# Patient Record
Sex: Male | Born: 1970 | Race: White | Hispanic: No | Marital: Married | Smoking: Former smoker
Health system: Southern US, Community
[De-identification: ages and names within clinical notes are randomized; demographics above are authoritative.]

## PROBLEM LIST (undated history)

## (undated) DIAGNOSIS — K4090 Unilateral inguinal hernia, without obstruction or gangrene, not specified as recurrent: Secondary | ICD-10-CM

## (undated) DIAGNOSIS — D485 Neoplasm of uncertain behavior of skin: Secondary | ICD-10-CM

## (undated) DIAGNOSIS — B029 Zoster without complications: Secondary | ICD-10-CM

## (undated) DIAGNOSIS — R030 Elevated blood-pressure reading, without diagnosis of hypertension: Secondary | ICD-10-CM

## (undated) HISTORY — PX: HERNIA REPAIR: SHX51

---

## 2008-01-08 ENCOUNTER — Emergency Department: Payer: Self-pay | Admitting: Emergency Medicine

## 2014-07-29 ENCOUNTER — Inpatient Hospital Stay: Payer: Self-pay | Admitting: Internal Medicine

## 2014-07-29 LAB — CBC WITH DIFFERENTIAL/PLATELET
Basophil #: 0 10*3/uL (ref 0.0–0.1)
Basophil %: 0.4 %
Eosinophil #: 0 10*3/uL (ref 0.0–0.7)
Eosinophil %: 0 %
HCT: 47.2 % (ref 40.0–52.0)
HGB: 16.3 g/dL (ref 13.0–18.0)
Lymphocyte #: 1.1 10*3/uL (ref 1.0–3.6)
Lymphocyte %: 14.4 %
MCH: 31.2 pg (ref 26.0–34.0)
MCHC: 34.5 g/dL (ref 32.0–36.0)
MCV: 90 fL (ref 80–100)
MONOS PCT: 7.5 %
Monocyte #: 0.6 x10 3/mm (ref 0.2–1.0)
NEUTROS ABS: 6 10*3/uL (ref 1.4–6.5)
Neutrophil %: 77.7 %
Platelet: 191 10*3/uL (ref 150–440)
RBC: 5.22 10*6/uL (ref 4.40–5.90)
RDW: 12.4 % (ref 11.5–14.5)
WBC: 7.8 10*3/uL (ref 3.8–10.6)

## 2014-07-29 LAB — COMPREHENSIVE METABOLIC PANEL
ALBUMIN: 3.4 g/dL (ref 3.4–5.0)
Alkaline Phosphatase: 89 U/L (ref 46–116)
Anion Gap: 10 (ref 7–16)
BILIRUBIN TOTAL: 0.5 mg/dL (ref 0.2–1.0)
BUN: 12 mg/dL (ref 7–18)
Calcium, Total: 8.6 mg/dL (ref 8.5–10.1)
Chloride: 94 mmol/L — ABNORMAL LOW (ref 98–107)
Co2: 25 mmol/L (ref 21–32)
Creatinine: 1.23 mg/dL (ref 0.60–1.30)
EGFR (African American): >60
Glucose: 136 mg/dL — ABNORMAL HIGH (ref 65–99)
Osmolality: 261 (ref 275–301)
POTASSIUM: 3.3 mmol/L — AB (ref 3.5–5.1)
SGOT(AST): 39 U/L — ABNORMAL HIGH (ref 15–37)
SGPT (ALT): 35 U/L (ref 14–63)
SODIUM: 129 mmol/L — AB (ref 136–145)
Total Protein: 7.8 g/dL (ref 6.4–8.2)

## 2014-07-29 LAB — URINALYSIS, COMPLETE
Bacteria: NONE SEEN
Bilirubin,UR: NEGATIVE
Glucose,UR: NEGATIVE mg/dL (ref 0–75)
Ketone: NEGATIVE
Leukocyte Esterase: NEGATIVE
Nitrite: NEGATIVE
Ph: 6 (ref 4.5–8.0)
Protein: NEGATIVE
RBC,UR: <1 /HPF (ref 0–5)
Specific Gravity: 1.001 (ref 1.003–1.030)
Squamous Epithelial: NONE SEEN
WBC UR: NONE SEEN /HPF (ref 0–5)

## 2014-07-29 LAB — TROPONIN I

## 2014-07-29 LAB — CLOSTRIDIUM DIFFICILE(ARMC)

## 2014-07-29 LAB — LIPASE, BLOOD: Lipase: 72 U/L — ABNORMAL LOW (ref 73–393)

## 2014-07-30 LAB — BASIC METABOLIC PANEL
Anion Gap: 6 — ABNORMAL LOW (ref 7–16)
BUN: 4 mg/dL — ABNORMAL LOW (ref 7–18)
CALCIUM: 8 mg/dL — AB (ref 8.5–10.1)
CO2: 28 mmol/L (ref 21–32)
Chloride: 103 mmol/L (ref 98–107)
Creatinine: 0.92 mg/dL (ref 0.60–1.30)
EGFR (Non-African Amer.): >60
Glucose: 127 mg/dL — ABNORMAL HIGH (ref 65–99)
OSMOLALITY: 272 (ref 275–301)
POTASSIUM: 2.9 mmol/L — AB (ref 3.5–5.1)
Sodium: 137 mmol/L (ref 136–145)

## 2014-07-30 LAB — MAGNESIUM: MAGNESIUM: 2.1 mg/dL

## 2014-07-31 LAB — BASIC METABOLIC PANEL
Anion Gap: 7 (ref 7–16)
BUN: 3 mg/dL — AB (ref 7–18)
CO2: 29 mmol/L (ref 21–32)
CREATININE: 0.83 mg/dL (ref 0.60–1.30)
Calcium, Total: 8.1 mg/dL — ABNORMAL LOW (ref 8.5–10.1)
Chloride: 105 mmol/L (ref 98–107)
EGFR (African American): >60
GLUCOSE: 105 mg/dL — AB (ref 65–99)
Osmolality: 278 (ref 275–301)
Potassium: 3.4 mmol/L — ABNORMAL LOW (ref 3.5–5.1)
SODIUM: 141 mmol/L (ref 136–145)

## 2014-08-01 LAB — STOOL CULTURE

## 2014-10-27 NOTE — Discharge Summary (Signed)
PATIENT NAME:  Ian Young, Ian R MR#:  [id number] DATE OF BIRTH:  [birth year]  DATE OF ADMISSION:  07/29/2014 DATE OF DISCHARGE:  07/31/2014  DISCHARGE DIAGNOSES:  1.  Pancolitis.  2.  Hyperkalemia hyponatremia.   SECONDARY DIAGNOSIS: None.   CONSULTATIONS:  Gastroenterology, Dr. Dow AdolphMatthew Rein    PROCEDURES AND RADIOLOGY:  CT scan of the abdomen and pelvis with contrast on 07/29/2014 showed colitis of near entire colon most significantly involving sigmoid colon. No perforation or abscess.   MAJOR LABORATORY PANEL:  1.  Urinalysis on admission was negative. 2.  Stool for Clostridium difficile was negative.  3.  Stool for comprehensive culture was negative.   HISTORY AND SHORT HOSPITAL COURSE: The patient is a 44 year old male with no significant medical problems who was admitted for abdominal pain, nausea, vomiting, diarrhea. Underwent CT scan of the abdomen and pelvis in the Emergency Department, showing pan colitis. Please see Dr. Margaretmary EddyShah's dictated history and physical for further details. The patient was started on IV Cipro and Flagyl.  A GI consultation was also obtained with Dr. Dow AdolphMatthew Rein who recommended continuing IV Cipro and Flagyl and slowly advancing the diet, which was done during the inpatient stay.  The patient was feeling much better by 07/31/2014 and was back to his baseline, and he was discharged home in stable condition. He did tolerate the diet and was not requiring any pain medication.   VITAL SIGNS: On the date of discharge, his vital signs are as follows: Temperature 97.7, heart rate 80 per minute, respirations 18 per minute, blood pressure 122/74.  He was saturating 98% on room air.    PERTINENT PHYSICAL EXAMINATION ON THE DATE OF DISCHARGE:  CARDIOVASCULAR: S1, S2 normal. No murmurs or gallops.  LUNGS: Clear to auscultation bilaterally. No wheezing, rales, rhonchi, crepitation.  ABDOMEN: Soft, benign.  NEUROLOGIC: Nonfocal examination. All other physical examination  remained at baseline.   DISCHARGE MEDICATIONS:  1.  Flagyl 500 mg p.o. t.i.d. for 5 day. 2.  Ciprofloxacin 500 mg p.o. b.i.d. for 5 days. 3.  Ibuprofen 600 mg p.o. 4 times a day for 5 days as needed.   DISCHARGE DIET: Regular.  Eat light for the first meal.   DISCHARGE ACTIVITY: As tolerated.   DISCHARGE INSTRUCTIONS AND FOLLOW-UP:  The patient was instructed to [facility] up [facility] in 2 to 4 weeks.  [facility] Dr. Dow AdolphMatthew Rein in 1 to 2 weeks.   TOTAL TIME DISCHARGING THIS PATIENT: 45 minutes.  He was given a work excuse from 07/29/2014 until 08/02/2014.       ____________________________ Ellamae SiaVipul S. Sherryll BurgerShah, MD vss:DT D: 07/31/2014 15:22:17 ET T: 07/31/2014 15:57:10 ET JOB#: [id number]  cc: Jnai Snellgrove S. Sherryll BurgerShah, MD, <Dictator> Dow AdolphMatthew Rein, MD [facility]  Ellamae SiaVIPUL S [facility] MD ELECTRONICALLY SIGNED 07/31/2014 20:20

## 2014-10-27 NOTE — H&P (Signed)
PATIENT NAME:  Ian Young, Ian Young MR#:  [id number] DATE OF BIRTH:  Mar 27, 1971  DATE OF ADMISSION:  07/29/2014  PRIMARY CARE PHYSICIAN: None.   REQUESTING PHYSICIAN: Koochiching SinkJade J. Dolores FrameSung, MD   CHIEF COMPLAINT: Abdominal pain, nausea, vomiting, diarrhea.   HISTORY OF PRESENT ILLNESS: The patient is a 44 year old male with no known medical problems, who is being admitted for possible colitis. The patient started having abdominal pain and fever since Saturday morning. He had to strain for his bowel, but were coming out to be very lose; about 1 every hour frequency. He was not able to keep any oral intake down; as soon as he would eat, he would start having 10 out of 10 pain, and had to go to the bathroom. After the bathroom, his pain would get relieved, but would come right back. He has tried Tylenol and Motrin, alternating for fever along with pain. He also continued to have nausea and some gagging, as he was not able to eat much. Starting this morning, he started having blood in the stool, which concerned him, and he decided to come to the Emergency Department.   While in the ED, he was having 10 out of 10 pain; his heart rate was 122, and he was found to have colitis based on a CT scan, and he is being admitted for further evaluation and management.   PAST MEDICAL HISTORY: None.   PAST SURGICAL HISTORY: The patient had hernia surgery 12 years ago.   ALLERGIES: SHELLFISH. NO OTHER DRUG ALLERGIES.   HOME MEDICATIONS: Naprosyn as needed for recent knee injury, although he is not taking anything for about a month now.   FAMILY HISTORY: His father had diabetes and also had hypertension. Grandmother and uncle had lung cancer.   SOCIAL HISTORY: Tobacco: No smoking. He does use electronic cigarettes and vapors.  Alcohol: He drinks about 12 beers a day, about 4-5 days a week. 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 He denies any IV drugs of abuse.   REVIEW OF  SYSTEMS:  CONSTITUTIONAL: No fever, fatigue, weakness.  EYES: No blurred or double vision.  ENT: No tinnitus or ear pain.  RESPIRATORY: No cough, wheezing, hemoptysis.  CARDIOVASCULAR: No chest pain, orthopnea, edema.  GASTROINTESTINAL: Positive for nausea, vomiting, diarrhea, abdominal pain.  GENITOURINARY: No dysuria or hematuria.  ENDOCRINE: No polyuria or nocturia.  HEMATOLOGY: Denied anemia or easy bruising.  SKIN: No rash or lesion.  MUSCULOSKELETAL: No arthritis or muscle cramp. He did have some recent knee injury requiring some Naprosyn as needed; some muscle strain.  NEUROLOGIC: No tingling, numbness, weakness.  PSYCHIATRY: No history of anxiety or depression.   PHYSICAL EXAMINATION:  VITAL SIGNS: Temperature 98.5, heart rate 122 per minute, respirations 20 per minute, blood pressure 157/108. He is saturating 98% on room air.  GENERAL: The patient is a 44 year old male lying in the bed, comfortably, without any acute distress.  EYES: Pupils equal, round, react to light and accommodation. No scleral icterus. Extraocular muscles intact.  HEENT: Head atraumatic, normocephalic. Oropharynx and nasopharynx clear. NECK: Supple. No jugular venous distention.  No thyroid enlargement or tenderness.  LUNGS: Clear to auscultation bilaterally. No wheezing, rales, rhonchi, or crepitation.  CARDIOVASCULAR: S1, S2 normal. Tachycardic. No murmurs, rubs or gallops.  ABDOMEN: Soft. Minimal tenderness in the left lower quadrant. Also some in the right lower quadrant; no guarding or rigidity.  Bowel sounds present. No organomegaly appreciated.  NEUROLOGIC: Cranial nerves  II through XII intact. Muscle strength 5/5 in all extremities. Sensation intact.  PSYCHIATRIC: The patient is alert and oriented x 3.  SKIN: No obvious rash, lesion or ulcer.  MUSCULOSKELETAL: No joint effusion or tenderness.  EXTREMITIES: no pedal edema, cyanosis or clubbing  LABORATORY DATA: Normal liver function tests, except  AST of 39. Normal first set of troponins. Normal CBC. Urinalysis is negative. BMP within normal limits, except sodium 129, potassium 3.3, chloride 94, blood sugar 126.   CT scan of the abdomen and pelvis with contrast in the Emergency Department showed colitis of the near entire colon, most significantly involving the sigmoid colon. No perforation or abscess.   EKG showed sinus tachycardia, LVH, no major ST-T changes.   IMPRESSION AND PLAN:  1.  Colitis. We will start him on IV Cipro and Flagyl. Consult gastroenterology. We will start him on IV hydration and obtain stool studies.  2.  Hypokalemia. We will replete and recheck. Check magnesium.  3.  Hyponatremia, likely due to dehydration from ongoing nausea, vomiting, diarrhea. We will start him on aggressive IV hydration and monitor sodium levels.   CODE STATUS: Full Code.  TOTAL TIME TAKING CARE OF THIS PATIENT: 40 minutes.    ____________________________ Ellamae Sia. Sherryll Burger, MD vss:MT D: 07/29/2014 15:49:25 ET T: 07/29/2014 16:26:26 ET JOB#: [id number]  cc: Jaydalynn Olivero S. Sherryll Burger, MD, <Dictator> Dow Adolph, MD Ellamae Sia [facility] MD ELECTRONICALLY SIGNED 07/30/2014 10:09

## 2014-10-27 NOTE — Consult Note (Signed)
PATIENT NAME:  Ian Young, Ian Young MR#:  [id number] DATE OF BIRTH:  1971/02/04  DATE OF CONSULTATION:  07/29/2014  REFERRING PHYSICIAN:  Vipul S. Sherryll Burger, MD CONSULTING PHYSICIAN:  Ian Adolph, MD  REASON FOR CONSULTATION: Diarrhea, pancolitis.   HISTORY OF PRESENT ILLNESS: [name] is a 44 year old male with no significant past medical history who is presenting to the hospital for evaluation of diarrhea and fever. [name] reports he was in his usual state of health until 2 days prior when he developed multiple episodes of liquidy diarrhea and also low-grade fevers. This continued over the weekend prompting him to present to the Emergency Room.   Here in the hospital starting this morning, he also started to develop some blood in his stools. Over the weekend, he was not having any blood whatsoever.   Of note, he went for a CT scan in the Emergency Room. The CT scan was notable for a pancolitis, which was worse in the sigmoid colon. Prior to this, he has not had any issues with diarrheal episodes. He denies any recent travel out of the country, any sick contacts, or any unusual foods that he has eaten recently.   PAST MEDICAL HISTORY: None.   PAST SURGICAL HISTORY: A hernia repair.   ALLERGIES: SHELLFISH.   HOME MEDICATIONS: P.r.n. nonsteroidal anti-inflammatories.   FAMILY HISTORY: No family history of GI malignancy.   SOCIAL HISTORY: He is a fairly heavy alcohol drinker, about 12 beers a day. He uses electronic cigarettes.   REVIEW OF SYSTEMS: A 10 system review was conducted. It is negative except as stated in the HPI.   PHYSICAL EXAMINATION:  VITAL SIGNS: Temperature 98.7, pulse is 81, respirations are 18, blood pressure 120/72, pulse oximetry is 97% on room air.  GENERAL: Alert and oriented times 4.  No acute distress. Appears stated age. HEENT: Normocephalic/atraumatic. Extraocular movements are intact. Anicteric. NECK: Soft, supple. JVP appears normal. No adenopathy. CHEST: Clear  to auscultation. No wheeze or crackle. Respirations unlabored. HEART: Regular. No murmur, rub, or gallop.  Normal S1 and S2. ABDOMEN: Mild diffuse tenderness to palpation. Normoactive bowel sounds. Soft. No rebound or guarding. EXTREMITIES: No swelling, well perfused. SKIN: No rash or lesion. Skin color, texture, turgor normal. NEUROLOGICAL: Grossly intact. PSYCHIATRIC: Normal tone and affect. MUSCULOSKELETAL: No joint swelling or erythema.   LABORATORY DATA: Sodium is 129, potassium 3.3, BUN 12, creatinine 1.23. Liver enzymes are normal except AST mildly elevated at 39. White count is 7.8, hemoglobin 16, hematocrit is 147, the platelets are 191,000.   IMAGING: See HPI for CT scan findings.   ASSESSMENT AND PLAN: Diarrhea, fever: He also does have a pancolitis on CT scan. Given the rather sudden onset over the weekend along with low grade fevers along with pain colitis, I do suspect this is most likely an infectious etiology. Now that he has developed some bleeding along with the diarrhea would potentially suggest a bacteria such as invasive Escherichia coli, Shigella, Campylobacter, or Salmonella. Clostridium difficile also can cause some rectal bleeding in the setting of severe inflammation. The other main item on the differential for this presentation with the pancolitis on CT would be inflammatory bowel disease. However, given the acute onset along with fever, I would favor an infectious etiology.   RECOMMENDATIONS:  1.  Empiric antibiotics are reasonable as you are doing.  2.  Follow Clostridium difficile and stool studies.  3.  If no improvement on antibiotics and no etiology on stool studies, would consider a colonoscopy for biopsies  at that juncture.   Thank you for the consult, we will follow.    ____________________________ Ian AdolphMatthew Toan Mort, MD mr:TT D: 07/29/2014 19:54:55 ET T: 07/29/2014 20:16:05 ET JOB#: [id number]  cc: Ian AdolphMatthew Geneva Barrero, MD, <Dictator> Ian FramesMATTHEW G Deniya Craigo  MD ELECTRONICALLY SIGNED 08/12/2014 15:12

## 2016-04-16 IMAGING — CT CT ABD-PELV W/ CM
2 of 5 series · 16 of 46 positions shown, 18 images · IV contrast (omnipaque)
Comparison: 01/08/2008

CLINICAL DATA: Left lower quadrant abdominal pain and fever.
Symptoms for 2 days.

EXAM:
CT ABDOMEN AND PELVIS WITH CONTRAST
TECHNIQUE: Multidetector CT imaging of the abdomen and pelvis was performed
using the standard protocol following bolus administration of
intravenous contrast.
CONTRAST:  100 mL Omnipaque 300 IV.

[Series 2: routine abd pel with · axial · 0.72mm/px · z∈[-514,-84]mm · 13 of 98 slices shown, 15 images]
[im 6/98  soft-tissue]
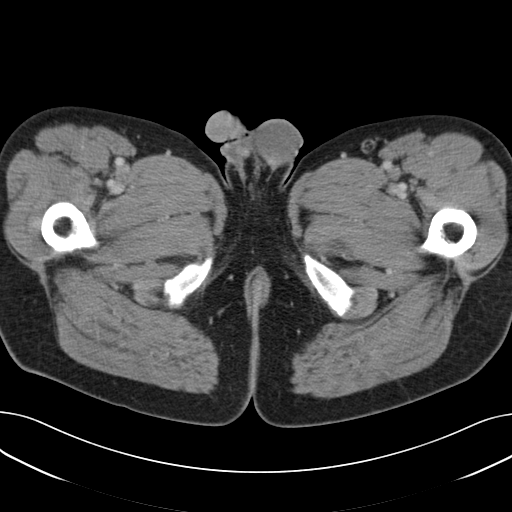
[im 6/98  bone]
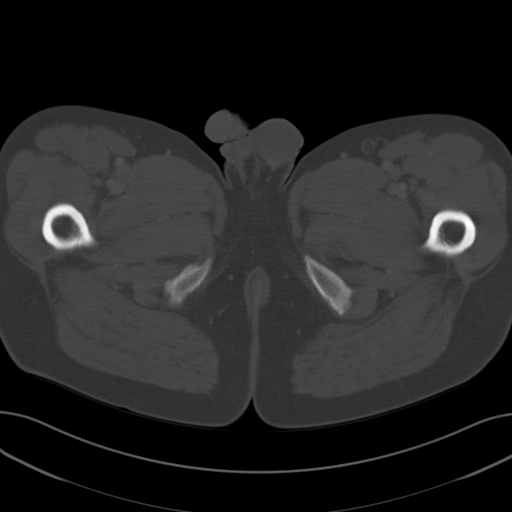
[im 11/98  soft-tissue]
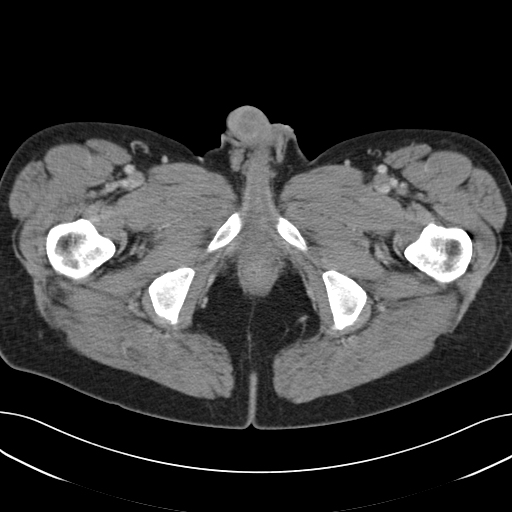
[im 22/98  soft-tissue]
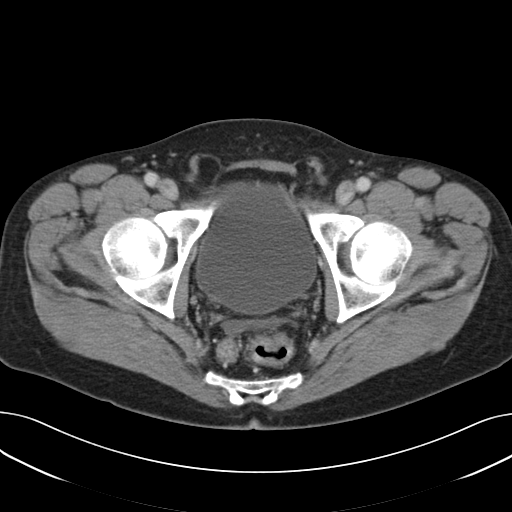
[im 27/98  soft-tissue]
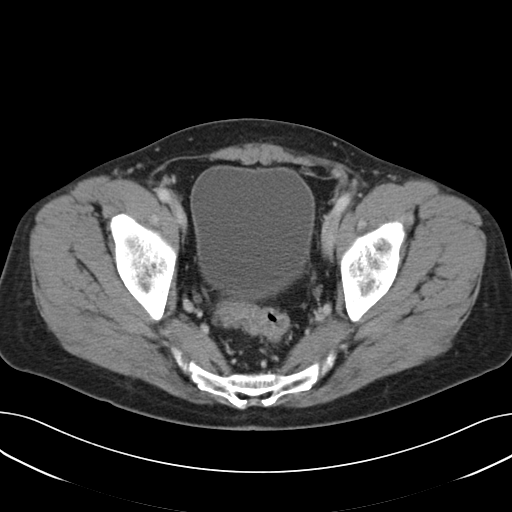
[im 33/98  soft-tissue]
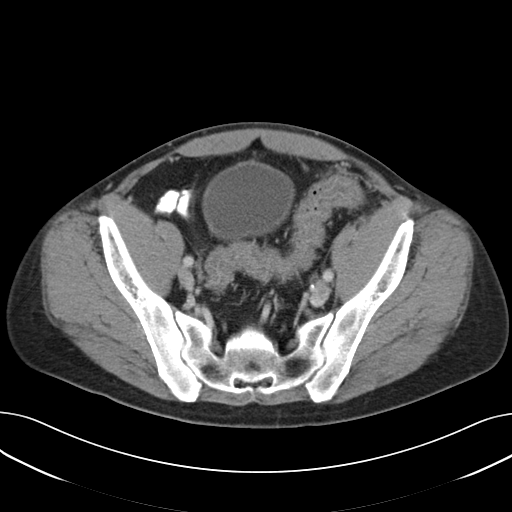
[im 44/98  soft-tissue]
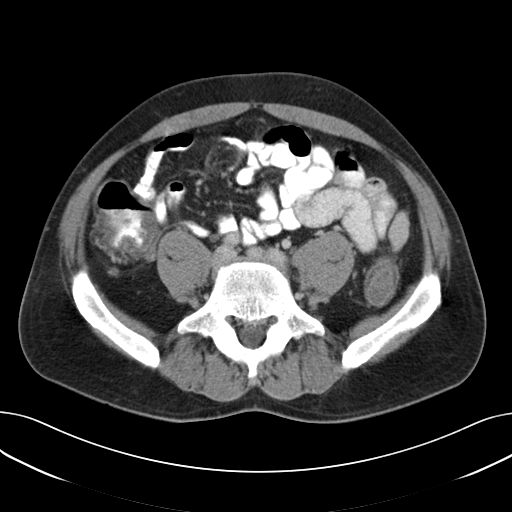
[im 49/98  soft-tissue]
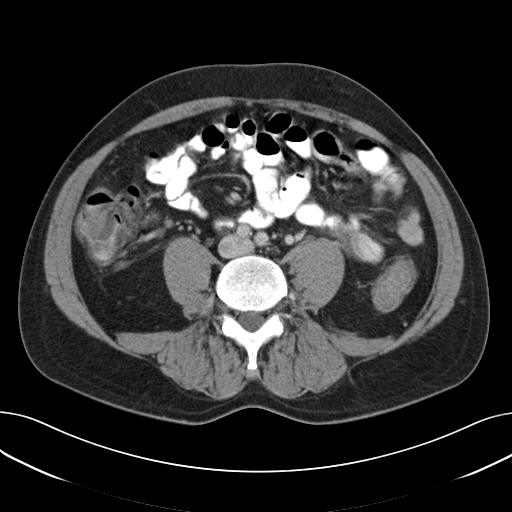
[im 54/98  soft-tissue]
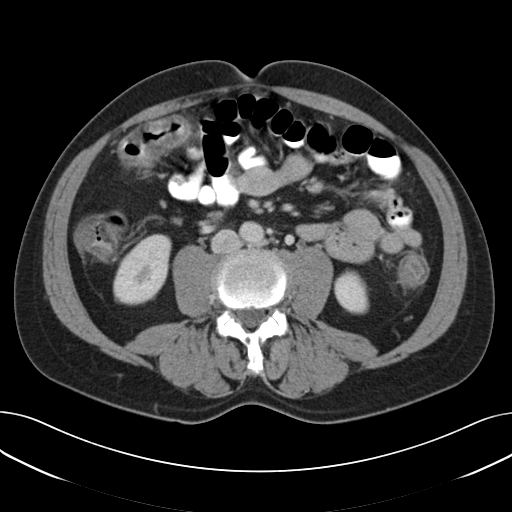
[im 65/98  soft-tissue]
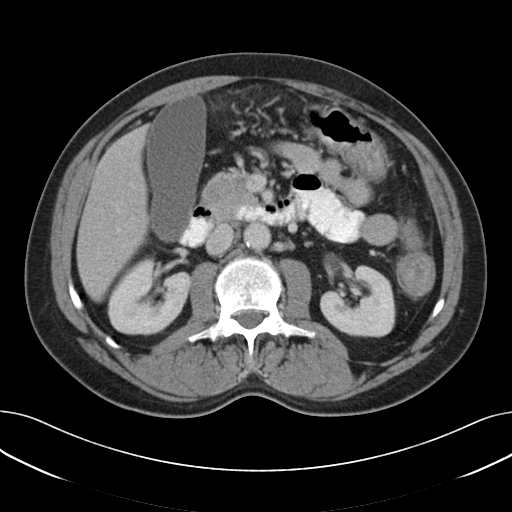
[im 65/98  bone]
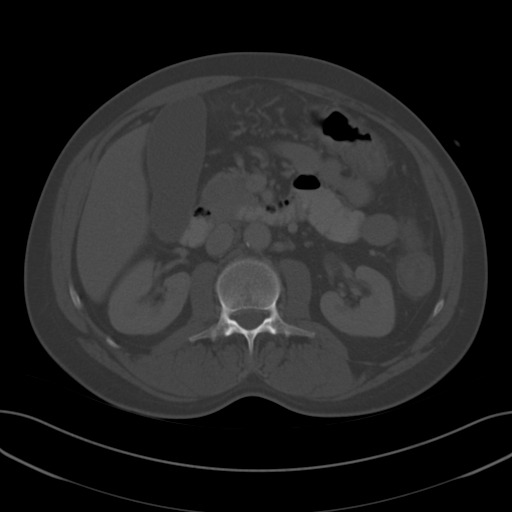
[im 71/98  soft-tissue]
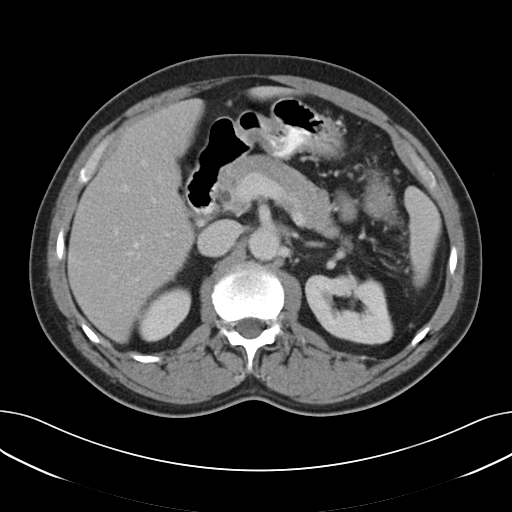
[im 76/98  soft-tissue]
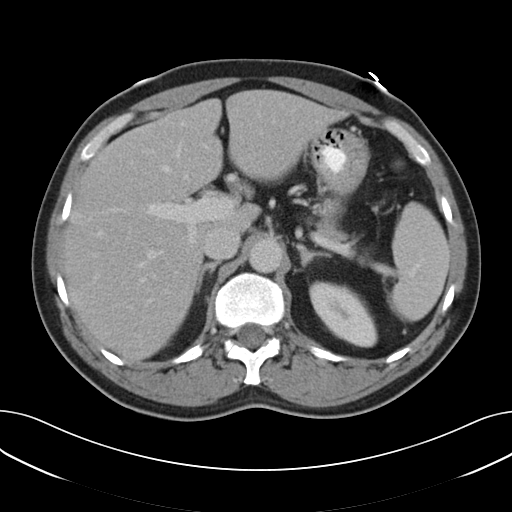
[im 87/98  soft-tissue]
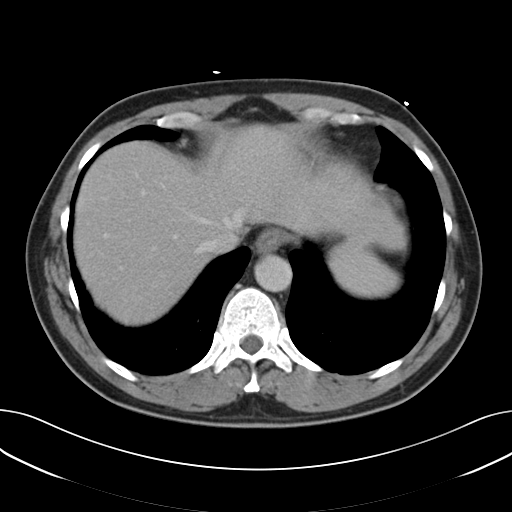
[im 92/98  soft-tissue]
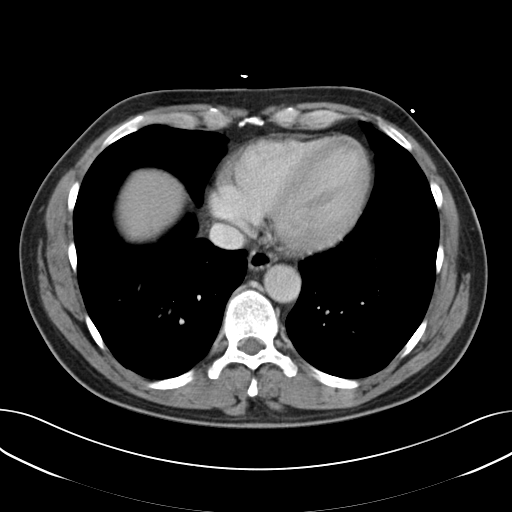

[Series 5: cor routine abd pel with · coronal · 0.75mm/px · 3 of 125 slices shown]
[im 42/125  soft-tissue]
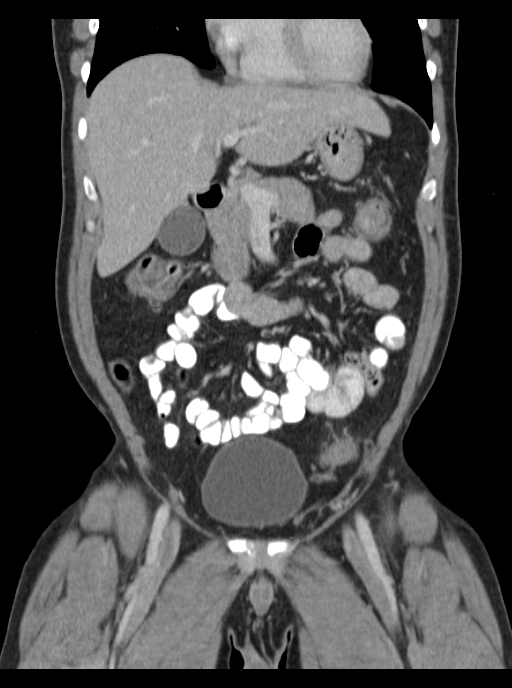
[im 56/125  soft-tissue]
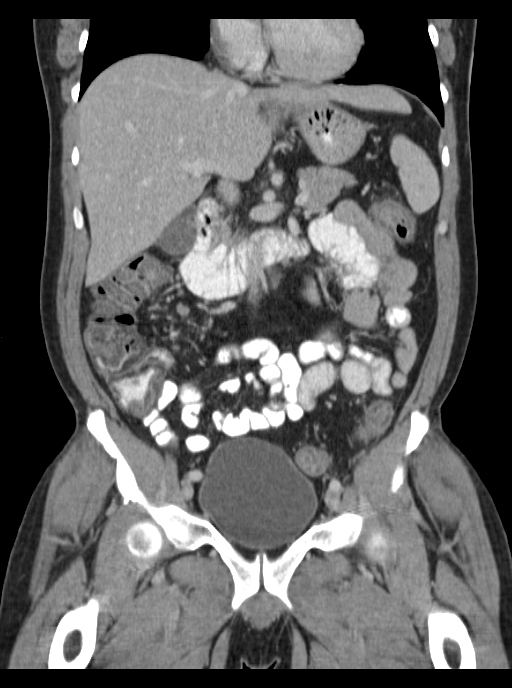
[im 69/125  soft-tissue]
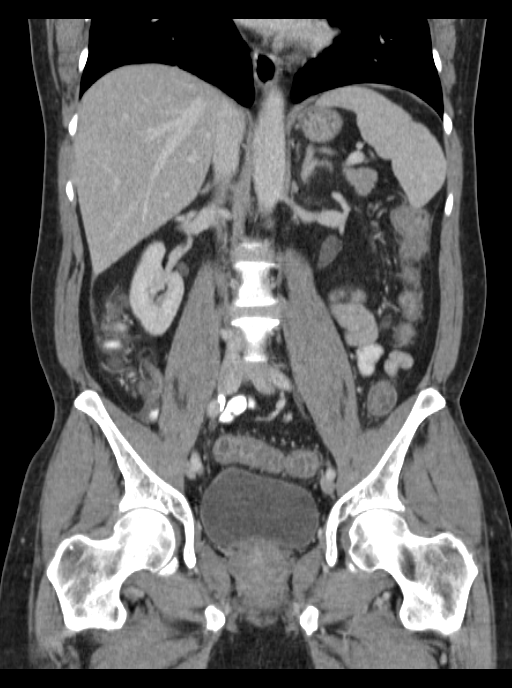

[16 of 46 positions shown; findings below may reference images not displayed]

FINDINGS: The included lung bases are clear.

There is abnormal appearance of the entire colon from the sigmoid to
the cecum. There is diffuse colonic wall thickening with mild
pericolonic inflammatory change. Findings are significant involving
the sigmoid colon. No pneumatosis, perforation, or abscess. There is
no small bowel dilatation or small bowel wall thickening. The
appendix is borderline in size measuring 7 mm, this is likely
reactive and related to the colonic process.

Gallbladder is mildly distended, no pericholecystic inflammatory
change. The liver, pancreas, spleen, adrenal glands, and kidneys are
normal. No free air, free fluid, or intra-abdominal fluid
collection. The abdominal aorta is normal in caliber. There is
minimal atherosclerosis. No retroperitoneal adenopathy.

Within the pelvis the urinary bladder is physiologically distended.
Small amount of free fluid dependently is likely reactive. The
prostate gland is normal in size. There are no acute or suspicious
osseous abnormalities.
IMPRESSION: Colitis of the near entire colon, most significant involving the
sigmoid colon. There is no perforation or abscess. Findings may be
related to infectious or inflammatory etiologies. Mild prominence of
the appendix is likely reactive related to the colonic process.
Additionally small amount of free fluid in the pelvis is likely
reactive.

## 2016-06-02 ENCOUNTER — Other Ambulatory Visit
Admit: 2016-06-02 | Discharge: 2016-06-02 | Disposition: A | Discharge status: Home / Self Care | Payer: Self-pay | Attending: Family Medicine | Admitting: Family Medicine

## 2016-06-02 NOTE — ED Notes (Signed)
Pt arrived post accident for workmans comp UDS only.  Pt does not wish to see an MD.  Denies injury.  UDS done and sent to lab.

## 2017-02-14 ENCOUNTER — Ambulatory Visit: Payer: Self-pay | Admitting: Physician Assistant

## 2017-02-14 ENCOUNTER — Encounter: Payer: Self-pay | Admitting: Physician Assistant

## 2017-02-14 VITALS — BP 126/90 | HR 80 | Temp 97.7°F

## 2017-02-14 DIAGNOSIS — B029 Zoster without complications: Secondary | ICD-10-CM

## 2017-02-14 MED ORDER — FAMCICLOVIR 500 MG PO TABS: 500.0000 mg | ORAL_TABLET | Freq: Three times a day (TID) | ORAL | 0 refills | Status: DC

## 2017-02-14 MED ORDER — METHYLPREDNISOLONE 4 MG PO TBPK: ORAL_TABLET | ORAL | 0 refills | Status: DC

## 2017-02-14 NOTE — Progress Notes (Signed)
S: c/o rash on left arm, was sore and had hard areas now they are raised in patches, red, no vesicles or drainage, no pain or tingling  O: vitals wnl, nad, left arm with erythematous based patches with lesions typical of shingles, tracks from shoulder to hand, and left thumb, no drainage, n/v intact  A: shingles  P: famvir, medrol dose pack

## 2023-05-12 ENCOUNTER — Ambulatory Visit: Payer: Self-pay | Admitting: General Surgery

## 2023-05-12 ENCOUNTER — Telehealth: Payer: Self-pay | Admitting: General Surgery

## 2023-05-12 ENCOUNTER — Ambulatory Visit: Payer: Managed Care, Other (non HMO) | Admitting: General Surgery

## 2023-05-12 ENCOUNTER — Encounter: Payer: Self-pay | Admitting: General Surgery

## 2023-05-12 VITALS — BP 159/89 | HR 80 | Temp 98.2°F | Ht 70.0 in | Wt 158.2 lb

## 2023-05-12 DIAGNOSIS — K4091 Unilateral inguinal hernia, without obstruction or gangrene, recurrent: Secondary | ICD-10-CM | POA: Diagnosis not present

## 2023-05-12 DIAGNOSIS — K4090 Unilateral inguinal hernia, without obstruction or gangrene, not specified as recurrent: Secondary | ICD-10-CM

## 2023-05-12 NOTE — Patient Instructions (Signed)
 You have chose to have your hernia repaired. This will be done by Dr. Maurine Minister at [facility].  Please see your (blue) Pre-care information that you have been given today. Our surgery scheduler will call you to verify surgery date and to go over information.   You will need to arrange to be out of work for approximately 1-2 weeks and then you may return with a lifting restriction for 4 more weeks. If you have FMLA or Disability paperwork that needs to be filled out, please have your company fax your paperwork to [phone] or you may drop this by either office. This paperwork will be filled out within 3 days after your surgery has been completed.  You may have a bruise in your groin and also swelling and brusing in your testicle area. You may use ice 4-5 times daily for 15-20 minutes each time. Make sure that you place a barrier between you and the ice pack. To decrease the swelling, you may roll up a bath towel and place it vertically in between your thighs with your testicles resting on the towel. You will want to keep this area elevated as much as possible for several days following surgery.    Inguinal Hernia, Adult Muscles help keep everything in the body in its proper place. But if a weak spot in the muscles develops, something can poke through. That is called a hernia. When this happens in the lower part of the belly (abdomen), it is called an inguinal hernia. (It takes its name from a part of the body in this region called the inguinal canal.) A weak spot in the wall of muscles lets some fat or part of the small intestine bulge through. An inguinal hernia can develop at any age. Men get them more often than women. CAUSES  In adults, an inguinal hernia develops over time. It can be triggered by: Suddenly straining the muscles of the lower abdomen. Lifting heavy objects. Straining to have a bowel movement. Difficult bowel movements (constipation) can lead to this. Constant coughing. This  may be caused by smoking or lung disease. Being overweight. Being pregnant. Working at a job that requires long periods of standing or heavy lifting. Having had an inguinal hernia before. One type can be an emergency situation. It is called a strangulated inguinal hernia. It develops if part of the small intestine slips through the weak spot and cannot get back into the abdomen. The blood supply can be cut off. If that happens, part of the intestine may die. This situation requires emergency surgery. SYMPTOMS  Often, a small inguinal hernia has no symptoms. It is found when a healthcare provider does a physical exam. Larger hernias usually have symptoms.  In adults, symptoms may include: A lump in the groin. This is easier to see when the person is standing. It might disappear when lying down. In men, a lump in the scrotum. Pain or burning in the groin. This occurs especially when lifting, straining or coughing. A dull ache or feeling of pressure in the groin. Signs of a strangulated hernia can include: A bulge in the groin that becomes very painful and tender to the touch. A bulge that turns red or purple. Fever, nausea and vomiting. Inability to have a bowel movement or to pass gas. DIAGNOSIS  To decide if you have an inguinal hernia, a healthcare provider will probably do a physical examination. This will include asking questions about any symptoms you have noticed. The healthcare provider might  feel the groin area and ask you to cough. If an inguinal hernia is felt, the healthcare provider may try to slide it back into the abdomen. Usually no other tests are needed. TREATMENT  Treatments can vary. The size of the hernia makes a difference. Options include: Watchful waiting. This is often suggested if the hernia is small and you have had no symptoms. No medical procedure will be done unless symptoms develop. You will need to watch closely for symptoms. If any occur, contact your  healthcare provider right away. Surgery. This is used if the hernia is larger or you have symptoms. Open surgery. This is usually an outpatient procedure (you will not stay overnight in a hospital). An cut (incision) is made through the skin in the groin. The hernia is put back inside the abdomen. The weak area in the muscles is then repaired by herniorrhaphy or hernioplasty. Herniorrhaphy: in this type of surgery, the weak muscles are sewn back together. Hernioplasty: a patch or mesh is used to close the weak area in the abdominal wall. Laparoscopy. In this procedure, a surgeon makes small incisions. A thin tube with a tiny video camera (called a laparoscope) is put into the abdomen. The surgeon repairs the hernia with mesh by looking with the video camera and using two long instruments. HOME CARE INSTRUCTIONS  After surgery to repair an inguinal hernia: You will need to take pain medicine prescribed by your healthcare provider. Follow all directions carefully. You will need to take care of the wound from the incision. Your activity will be restricted for awhile. This will probably include no heavy lifting for several weeks. You also should not do anything too active for a few weeks. When you can return to work will depend on the type of job that you have. During "watchful waiting" periods, you should: Maintain a healthy weight. Eat a diet high in fiber (fruits, vegetables and whole grains). Drink plenty of fluids to avoid constipation. This means drinking enough water and other liquids to keep your urine clear or pale yellow. Do not lift heavy objects. Do not stand for long periods of time. Quit smoking. This should keep you from developing a frequent cough. SEEK MEDICAL CARE IF:  A bulge develops in your groin area. You feel pain, a burning sensation or pressure in the groin. This might be worse if you are lifting or straining. You develop a fever of more than 100.5 F (38.1 C). SEEK  IMMEDIATE MEDICAL CARE IF:  Pain in the groin increases suddenly. A bulge in the groin gets bigger suddenly and does not go down. For men, there is sudden pain in the scrotum. Or, the size of the scrotum increases. A bulge in the groin area becomes red or purple and is painful to touch. You have nausea or vomiting that does not go away. You feel your heart beating much faster than normal. You cannot have a bowel movement or pass gas. You develop a fever of more than 102.0 F (38.9 C).   This information is not intended to replace advice given to you by your health care provider. Make sure you discuss any questions you have with your health care provider.   Document Released: 10/31/2008 Document Revised: 09/06/2011 Document Reviewed: 12/16/2014 Elsevier Interactive Patient Education [employer]

## 2023-05-12 NOTE — Progress Notes (Signed)
Patient ID: Ian Young, male   DOB: February 10, 1971, 52 y.o.   MRN: [id number] CC: Left Inguinal hernia History of Present Illness Ian Young is a 52 y.o. male with no significant past past medical history who presents in evaluation today for left inguinal hernia.  The patient reports that he was lifting something heavy several weeks ago and then noticed a bulge in his left groin.  He says the bulge is reducible.  He also reports that he has pain from the site of the bulge that radiates down to his groin.  He denies any overlying skin changes or obstructive symptoms.  Of note, the patient had a left inguinal hernia repair done open about 20 years ago.  He has had no problems since then..  Past Medical History History reviewed. No pertinent past medical history.     Surgical history consistent with a open left inguinal hernia.  No Known Allergies  Current Outpatient Medications  Medication Sig Dispense Refill   naproxen (NAPROSYN) 500 MG tablet Take 500 mg by mouth 2 (two) times daily with a meal.     No current facility-administered medications for this visit.    Family History History reviewed. No pertinent family history.   Social History Social History   Tobacco Use   Smoking status: Every Day      ROS Full ROS of systems performed and is otherwise negative there than what is stated in the HPI  Physical Exam Blood pressure (!) 159/89, pulse 80, temperature 98.2 F (36.8 C), temperature source Oral, height 5\' 10"  (1.778 m), weight 158 lb 3.2 oz (71.8 kg), SpO2 98%.  No acute distress, PERRLA, moving extremity spontaneously, mood and affect appropriate, normal work of breathing on room air Abdomen is soft, nontender nondistended.  Left groin there is a bulge with Valsalva movement just medial to his well-healed surgical scar.  This bulge is easily reducible   Assessment    52 year old male with recurrent, reducible left inguinal hernia.  He had an open repair some 20 years  ago.  He is interested in reoperation for hernia.  Plan    Will plan for robotic assisted left inguinal hernia.  I discussed with the patient that given he has a recurrence this reoperative surgery does include risk of infection, bleeding.  He also has risk of damage to spermatic cord damage to the nerves causing chronic pain as well as testicular ischemia.  I also reiterated to the patient that there is a risk of recurrence but that we would use mesh to reduce that risk.  He understands these risks and wishes to proceed with surgery.    Kandis Cocking 05/12/2023, 12:08 PM

## 2023-05-12 NOTE — Telephone Encounter (Signed)
Patient has been advised of Pre-Admission date/time, and Surgery date at [facility].  Surgery Date: 05/16/23 Preadmission Testing Date: 05/13/23 (phone 8a-1p)  Patient has been made aware to call [phone], between 1-3:00pm the day before surgery, to find out what time to arrive for surgery.   '

## 2023-05-13 ENCOUNTER — Other Ambulatory Visit: Payer: Self-pay

## 2023-05-13 ENCOUNTER — Encounter
Admission: RE | Admit: 2023-05-13 | Discharge: 2023-05-13 | Disposition: A | Discharge status: Home / Self Care | Payer: Managed Care, Other (non HMO) | Source: Ambulatory Visit | Attending: General Surgery

## 2023-05-13 HISTORY — DX: Unilateral inguinal hernia, without obstruction or gangrene, not specified as recurrent: K40.90

## 2023-05-13 HISTORY — DX: Neoplasm of uncertain behavior of skin: D48.5

## 2023-05-13 HISTORY — DX: Elevated blood-pressure reading, without diagnosis of hypertension: R03.0

## 2023-05-13 HISTORY — DX: Zoster without complications: B02.9

## 2023-05-13 NOTE — Patient Instructions (Addendum)
Your procedure is scheduled on: 05/16/23 - Monday Report to the Registration Desk on the 1st floor of the [facility]. To find out your arrival time, please call [phone] between 1PM - 3PM on: 05/13/23 - Friday If your arrival time is 6:00 am, do not arrive before that time as the Medical Mall entrance doors do not open until 6:00 am.  REMEMBER: Instructions that are not followed completely may result in serious medical risk, up to and including death; or upon the discretion of your surgeon and anesthesiologist your surgery may need to be rescheduled.  Do not eat food or drink any liquids after midnight the night before surgery.  No gum chewing or hard candies.   One week prior to surgery: Stop Anti-inflammatories (NSAIDS) such as Advil, Aleve, Ibuprofen, Motrin, Naproxen, Naprosyn and Aspirin based products such as Excedrin, Goody's Powder, BC Powder. You may take Tylenol if needed for pain up until the day of surgery.  Stop ANY OVER THE COUNTER supplements until after surgery.   ON THE DAY OF SURGERY ONLY TAKE THESE MEDICATIONS WITH SIPS OF WATER:  none   No Alcohol for 24 hours before or after surgery.  No Smoking including e-cigarettes for 24 hours before surgery.  No chewable tobacco products for at least 6 hours before surgery.  No nicotine patches on the day of surgery.  Do not use any "recreational" drugs for at least a week (preferably 2 weeks) before your surgery.  Please be advised that the combination of cocaine and anesthesia may have negative outcomes, up to and including death. If you test positive for cocaine, your surgery will be cancelled.  On the morning of surgery brush your teeth with toothpaste and water, you may rinse your mouth with mouthwash if you wish. Do not swallow any toothpaste or mouthwash.  Do not wear jewelry, make-up, hairpins, clips or nail polish.  For welded (permanent) jewelry: bracelets, anklets, waist bands, etc.  Please have this  removed prior to surgery.  If it is not removed, there is a chance that hospital personnel will need to cut it off on the day of surgery.  Do not wear lotions, powders, or perfumes.   Do not shave body hair from the neck down 48 hours before surgery.  Contact lenses, hearing aids and dentures may not be worn into surgery.  Do not bring valuables to the hospital. [facility] is not responsible for any missing/lost belongings or valuables.   Notify your doctor if there is any change in your medical condition (cold, fever, infection).  Wear comfortable clothing (specific to your surgery type) to the hospital.  After surgery, you can help prevent lung complications by doing breathing exercises.  Take deep breaths and cough every 1-2 hours. Your doctor may order a device called an Incentive Spirometer to help you take deep breaths. When coughing or sneezing, hold a pillow firmly against your incision with both hands. This is called "splinting." Doing this helps protect your incision. It also decreases belly discomfort.  If you are being admitted to the hospital overnight, leave your suitcase in the car. After surgery it may be brought to your room.  In case of increased patient census, it may be necessary for you, the patient, to continue your postoperative care in the Same Day Surgery department.  If you are being discharged the day of surgery, you will not be allowed to drive home. You will need a responsible individual to drive you home and stay with you  for 24 hours after surgery.   If you are taking public transportation, you will need to have a responsible individual with you.  Please call the Pre-admissions Testing Dept. at [phone] if you have any questions about these instructions.  Surgery Visitation Policy:  Patients having surgery or a procedure may have two visitors.  Children under the age of 30 must have an adult with them who is not the patient.  Inpatient  Visitation:    Visiting hours are 7 a.m. to 8 p.m. Up to four visitors are allowed at one time in a patient room. The visitors may rotate out with other people during the day.  One visitor age 33 or older may stay with the patient overnight and must be in the room by 8 p.m.

## 2023-05-16 ENCOUNTER — Ambulatory Visit: Payer: Self-pay | Admitting: Urgent Care

## 2023-05-16 ENCOUNTER — Ambulatory Visit: Payer: Managed Care, Other (non HMO) | Admitting: Urgent Care

## 2023-05-16 ENCOUNTER — Encounter
Admission: RE | Disposition: A | Discharge status: Home / Self Care | Payer: Self-pay | Source: Home / Self Care | Attending: General Surgery

## 2023-05-16 ENCOUNTER — Other Ambulatory Visit: Payer: Self-pay

## 2023-05-16 ENCOUNTER — Encounter: Payer: Self-pay | Admitting: General Surgery

## 2023-05-16 ENCOUNTER — Ambulatory Visit
Admission: RE | Admit: 2023-05-16 | Discharge: 2023-05-16 | Disposition: A | Discharge status: Home / Self Care | Payer: Managed Care, Other (non HMO) | Attending: General Surgery | Admitting: General Surgery

## 2023-05-16 DIAGNOSIS — K4091 Unilateral inguinal hernia, without obstruction or gangrene, recurrent: Secondary | ICD-10-CM | POA: Diagnosis present

## 2023-05-16 DIAGNOSIS — G4730 Sleep apnea, unspecified: Secondary | ICD-10-CM | POA: Insufficient documentation

## 2023-05-16 HISTORY — PX: XI ROBOTIC ASSISTED INGUINAL HERNIA REPAIR WITH MESH: SHX6706

## 2023-05-16 SURGERY — REPAIR, HERNIA, INGUINAL, ROBOT-ASSISTED, LAPAROSCOPIC, USING MESH
Anesthesia: General | Laterality: Left

## 2023-05-16 MED ORDER — FENTANYL CITRATE (PF) 100 MCG/2ML IJ SOLN
INTRAMUSCULAR | Status: AC
  Filled 2023-05-16: qty 2

## 2023-05-16 MED ORDER — KETAMINE HCL 50 MG/5ML IJ SOSY
PREFILLED_SYRINGE | INTRAMUSCULAR | Status: DC | PRN
  Administered 2023-05-16: 10 mg via INTRAVENOUS
  Administered 2023-05-16: 20 mg via INTRAVENOUS
  Administered 2023-05-16 (×2): 10 mg via INTRAVENOUS

## 2023-05-16 MED ORDER — DEXAMETHASONE SODIUM PHOSPHATE 10 MG/ML IJ SOLN
INTRAMUSCULAR | Status: AC
  Filled 2023-05-16: qty 1

## 2023-05-16 MED ORDER — LACTATED RINGERS IV SOLN: INTRAVENOUS | Status: DC

## 2023-05-16 MED ORDER — 0.9 % SODIUM CHLORIDE (POUR BTL) OPTIME
TOPICAL | Status: DC | PRN
  Administered 2023-05-16: 500 mL

## 2023-05-16 MED ORDER — ROCURONIUM BROMIDE 10 MG/ML (PF) SYRINGE
PREFILLED_SYRINGE | INTRAVENOUS | Status: AC
  Filled 2023-05-16: qty 10

## 2023-05-16 MED ORDER — PROPOFOL 10 MG/ML IV BOLUS
INTRAVENOUS | Status: AC
  Filled 2023-05-16: qty 20

## 2023-05-16 MED ORDER — MIDAZOLAM HCL 2 MG/2ML IJ SOLN
INTRAMUSCULAR | Status: DC | PRN
  Administered 2023-05-16: 2 mg via INTRAVENOUS

## 2023-05-16 MED ORDER — MIDAZOLAM HCL 2 MG/2ML IJ SOLN
INTRAMUSCULAR | Status: AC
Start: 2023-05-16 — End: ?
  Filled 2023-05-16: qty 2

## 2023-05-16 MED ORDER — ONDANSETRON HCL 4 MG/2ML IJ SOLN
INTRAMUSCULAR | Status: DC | PRN
  Administered 2023-05-16: 4 mg via INTRAVENOUS

## 2023-05-16 MED ORDER — DEXAMETHASONE SODIUM PHOSPHATE 10 MG/ML IJ SOLN
INTRAMUSCULAR | Status: DC | PRN
  Administered 2023-05-16: 10 mg via INTRAVENOUS

## 2023-05-16 MED ORDER — OXYCODONE HCL 5 MG PO TABS
ORAL_TABLET | ORAL | Status: AC
  Filled 2023-05-16: qty 1

## 2023-05-16 MED ORDER — BUPIVACAINE LIPOSOME 1.3 % IJ SUSP
INTRAMUSCULAR | Status: AC
  Filled 2023-05-16: qty 10

## 2023-05-16 MED ORDER — BUPIVACAINE-EPINEPHRINE (PF) 0.5% -1:200000 IJ SOLN
INTRAMUSCULAR | Status: AC
Start: 2023-05-16 — End: ?
  Filled 2023-05-16: qty 10

## 2023-05-16 MED ORDER — CEFAZOLIN SODIUM-DEXTROSE 2-4 GM/100ML-% IV SOLN
INTRAVENOUS | Status: AC
  Filled 2023-05-16: qty 100

## 2023-05-16 MED ORDER — KETOROLAC TROMETHAMINE 30 MG/ML IJ SOLN
INTRAMUSCULAR | Status: DC | PRN
  Administered 2023-05-16: 30 mg via INTRAVENOUS

## 2023-05-16 MED ORDER — ONDANSETRON HCL 4 MG/2ML IJ SOLN
INTRAMUSCULAR | Status: AC
  Filled 2023-05-16: qty 4

## 2023-05-16 MED ORDER — ROCURONIUM BROMIDE 100 MG/10ML IV SOLN
INTRAVENOUS | Status: DC | PRN
  Administered 2023-05-16: 10 mg via INTRAVENOUS
  Administered 2023-05-16: 50 mg via INTRAVENOUS
  Administered 2023-05-16: 20 mg via INTRAVENOUS

## 2023-05-16 MED ORDER — FENTANYL CITRATE (PF) 100 MCG/2ML IJ SOLN: 25.0000 ug | INTRAMUSCULAR | Status: DC | PRN

## 2023-05-16 MED ORDER — FENTANYL CITRATE (PF) 100 MCG/2ML IJ SOLN
INTRAMUSCULAR | Status: DC | PRN
  Administered 2023-05-16 (×2): 50 ug via INTRAVENOUS
  Administered 2023-05-16: 100 ug via INTRAVENOUS

## 2023-05-16 MED ORDER — OXYCODONE HCL 5 MG/5ML PO SOLN: 5.0000 mg | Freq: Once | ORAL | Status: AC | PRN

## 2023-05-16 MED ORDER — ACETAMINOPHEN 10 MG/ML IV SOLN
INTRAVENOUS | Status: AC
  Filled 2023-05-16: qty 100

## 2023-05-16 MED ORDER — BUPIVACAINE-EPINEPHRINE (PF) 0.5% -1:200000 IJ SOLN
INTRAMUSCULAR | Status: DC | PRN
  Administered 2023-05-16: 20 mL

## 2023-05-16 MED ORDER — KETOROLAC TROMETHAMINE 30 MG/ML IJ SOLN
INTRAMUSCULAR | Status: AC
Start: 2023-05-16 — End: ?
  Filled 2023-05-16: qty 1

## 2023-05-16 MED ORDER — LIDOCAINE HCL (CARDIAC) PF 100 MG/5ML IV SOSY
PREFILLED_SYRINGE | INTRAVENOUS | Status: DC | PRN
  Administered 2023-05-16: 100 mg via INTRAVENOUS

## 2023-05-16 MED ORDER — SUGAMMADEX SODIUM 200 MG/2ML IV SOLN
INTRAVENOUS | Status: DC | PRN
  Administered 2023-05-16: 143.6 mg via INTRAVENOUS

## 2023-05-16 MED ORDER — CHLORHEXIDINE GLUCONATE 0.12 % MT SOLN
OROMUCOSAL | Status: AC
  Filled 2023-05-16: qty 15

## 2023-05-16 MED ORDER — OXYCODONE HCL 5 MG PO TABS: 5.0000 mg | ORAL_TABLET | Freq: Four times a day (QID) | ORAL | 0 refills | Status: DC | PRN

## 2023-05-16 MED ORDER — CHLORHEXIDINE GLUCONATE 0.12 % MT SOLN
15.0000 mL | Freq: Once | OROMUCOSAL | Status: AC
  Administered 2023-05-16: 15 mL via OROMUCOSAL

## 2023-05-16 MED ORDER — OXYCODONE HCL 5 MG PO TABS
5.0000 mg | ORAL_TABLET | Freq: Once | ORAL | Status: AC | PRN
  Administered 2023-05-16: 5 mg via ORAL

## 2023-05-16 MED ORDER — PROPOFOL 10 MG/ML IV BOLUS
INTRAVENOUS | Status: DC | PRN
  Administered 2023-05-16: 200 mg via INTRAVENOUS

## 2023-05-16 MED ORDER — CHLORHEXIDINE GLUCONATE CLOTH 2 % EX PADS
6.0000 | MEDICATED_PAD | Freq: Once | CUTANEOUS | Status: AC
  Administered 2023-05-16: 6 via TOPICAL

## 2023-05-16 MED ORDER — CEFAZOLIN SODIUM-DEXTROSE 2-4 GM/100ML-% IV SOLN
2.0000 g | INTRAVENOUS | Status: AC
  Administered 2023-05-16: 2 g via INTRAVENOUS

## 2023-05-16 MED ORDER — ORAL CARE MOUTH RINSE: 15.0000 mL | Freq: Once | OROMUCOSAL | Status: AC

## 2023-05-16 MED ORDER — KETAMINE HCL 50 MG/5ML IJ SOSY
PREFILLED_SYRINGE | INTRAMUSCULAR | Status: AC
  Filled 2023-05-16: qty 5

## 2023-05-16 MED ORDER — ACETAMINOPHEN 10 MG/ML IV SOLN
INTRAVENOUS | Status: DC | PRN
  Administered 2023-05-16: 1000 mg via INTRAVENOUS

## 2023-05-16 SURGICAL SUPPLY — 47 items
BAG PRESSURE INF REUSE 1000 (BAG) IMPLANT
BLADE SURG SZ11 CARB STEEL (BLADE) ×1 IMPLANT
COVER TIP SHEARS 8 DVNC (MISCELLANEOUS) ×1 IMPLANT
COVER WAND RF STERILE (DRAPES) ×1 IMPLANT
DERMABOND ADVANCED .7 DNX12 (GAUZE/BANDAGES/DRESSINGS) ×1 IMPLANT
DRAPE ARM DVNC X/XI (DISPOSABLE) ×3 IMPLANT
DRAPE COLUMN DVNC XI (DISPOSABLE) ×1 IMPLANT
DRIVER NDL MEGA SUTCUT DVNCXI (INSTRUMENTS) ×1 IMPLANT
DRIVER NDLE MEGA SUTCUT DVNCXI (INSTRUMENTS) ×1
ELECT REM PT RETURN 9FT ADLT (ELECTROSURGICAL) ×1
ELECTRODE REM PT RTRN 9FT ADLT (ELECTROSURGICAL) ×1 IMPLANT
FORCEPS BPLR R/ABLATION 8 DVNC (INSTRUMENTS) ×1 IMPLANT
GLOVE BIOGEL PI IND STRL 7.5 (GLOVE) ×2 IMPLANT
GLOVE SURG SYN 7.0 (GLOVE) ×2
GLOVE SURG SYN 7.0 PF PI (GLOVE) ×2 IMPLANT
GOWN STRL REUS W/ TWL LRG LVL3 (GOWN DISPOSABLE) ×3 IMPLANT
GOWN STRL REUS W/TWL LRG LVL3 (GOWN DISPOSABLE) ×4
IRRIGATOR SUCT 8 DISP DVNC XI (IRRIGATION / IRRIGATOR) IMPLANT
IV NS 1000ML (IV SOLUTION) ×1
IV NS 1000ML BAXH (IV SOLUTION) IMPLANT
KIT PINK PAD W/HEAD ARE REST (MISCELLANEOUS) ×1
KIT PINK PAD W/HEAD ARM REST (MISCELLANEOUS) ×1 IMPLANT
LABEL OR SOLS (LABEL) IMPLANT
MANIFOLD NEPTUNE II (INSTRUMENTS) ×1 IMPLANT
MESH 3DMAX MID 4X6 LT LRG (Mesh General) IMPLANT
MESH PROGRIP LAP SELF FIXATING (Mesh General) IMPLANT
NDL HYPO 22X1.5 SAFETY MO (MISCELLANEOUS) ×1 IMPLANT
NDL INSUFFLATION 14GA 120MM (NEEDLE) ×1 IMPLANT
NEEDLE HYPO 22X1.5 SAFETY MO (MISCELLANEOUS) ×1
NEEDLE INSUFFLATION 14GA 120MM (NEEDLE) ×1
OBTURATOR OPTICAL STND 8 DVNC (TROCAR) ×1
OBTURATOR OPTICALSTD 8 DVNC (TROCAR) ×1 IMPLANT
PACK LAP CHOLECYSTECTOMY (MISCELLANEOUS) ×1 IMPLANT
SCISSORS MNPLR CVD DVNC XI (INSTRUMENTS) ×1 IMPLANT
SEAL UNIV 5-12 XI (MISCELLANEOUS) ×3 IMPLANT
SET TUBE SMOKE EVAC HIGH FLOW (TUBING) ×1 IMPLANT
SOL ELECTROSURG ANTI STICK (MISCELLANEOUS) ×1
SOLUTION ELECTROSURG ANTI STCK (MISCELLANEOUS) ×1 IMPLANT
SUT MNCRL 4-0 (SUTURE) ×1
SUT MNCRL 4-0 27XMFL (SUTURE) ×1
SUT STRATA 2-0 23CM CT-2 (SUTURE) IMPLANT
SUT VIC AB 2-0 SH 27 (SUTURE) ×1
SUT VIC AB 2-0 SH 27XBRD (SUTURE) ×1 IMPLANT
SUTURE MNCRL 4-0 27XMF (SUTURE) ×1 IMPLANT
TAPE TRANSPORE STRL 2 31045 (GAUZE/BANDAGES/DRESSINGS) IMPLANT
TRAP FLUID SMOKE EVACUATOR (MISCELLANEOUS) ×1 IMPLANT
WATER STERILE IRR 500ML POUR (IV SOLUTION) ×1 IMPLANT

## 2023-05-16 NOTE — H&P (Signed)
No changes to below H and P, proceed with LEFT inguinal hernia repair   Patient ID: Ian Young, male   DOB: 1971/06/25, 52 y.o.   MRN: [id number] CC: Left Inguinal hernia History of Present Illness Ian Young is a 52 y.o. male with no significant past past medical history who presents in evaluation today for left inguinal hernia.  The patient reports that he was lifting something heavy several weeks ago and then noticed a bulge in his left groin.  He says the bulge is reducible.  He also reports that he has pain from the site of the bulge that radiates down to his groin.  He denies any overlying skin changes or obstructive symptoms.  Of note, the patient had a left inguinal hernia repair done open about 20 years ago.  He has had no problems since then..   Past Medical History History reviewed. No pertinent past medical history.          Surgical history consistent with a open left inguinal hernia.   Allergies  No Known Allergies           Current Outpatient Medications  Medication Sig Dispense Refill   naproxen (NAPROSYN) 500 MG tablet Take 500 mg by mouth 2 (two) times daily with a meal.          No current facility-administered medications for this visit.        Family History History reviewed. No pertinent family history.        Social History Social History  Social History        Tobacco Use   Smoking status: Every Day          ROS Full ROS of systems performed and is otherwise negative there than what is stated in the HPI   Physical Exam Blood pressure (!) 159/89, pulse 80, temperature 98.2 F (36.8 C), temperature source Oral, height 5\' 10"  (1.778 m), weight 158 lb 3.2 oz (71.8 kg), SpO2 98%.   No acute distress, PERRLA, moving extremity spontaneously, mood and affect appropriate, normal work of breathing on room air Abdomen is soft, nontender nondistended.  Left groin there is a bulge with Valsalva movement just medial to his well-healed surgical scar.   This bulge is easily reducible     Assessment   Assessment 51 year old male with recurrent, reducible left inguinal hernia.  He had an open repair some 20 years ago.  He is interested in reoperation for hernia.   Plan     Plan Will plan for robotic assisted left inguinal hernia.  I discussed with the patient that given he has a recurrence this reoperative surgery does include risk of infection, bleeding.  He also has risk of damage to spermatic cord damage to the nerves causing chronic pain as well as testicular ischemia.  I also reiterated to the patient that there is a risk of recurrence but that we would use mesh to reduce that risk.  He understands these risks and wishes to proceed with surgery.

## 2023-05-16 NOTE — Anesthesia Postprocedure Evaluation (Signed)
Anesthesia Post Note  Patient: Ian Young  Procedure(s) Performed: XI ROBOTIC ASSISTED INGUINAL HERNIA REPAIR WITH MESH, recurrent (Left)  Patient location during evaluation: PACU Anesthesia Type: General Level of consciousness: awake and alert, oriented and patient cooperative Pain management: pain level controlled Vital Signs Assessment: post-procedure vital signs reviewed and stable Respiratory status: spontaneous breathing, nonlabored ventilation and respiratory function stable Cardiovascular status: blood pressure returned to baseline and stable Postop Assessment: adequate PO intake Anesthetic complications: no   No notable events documented.   Last Vitals:  Vitals:   05/16/23 1120 05/16/23 1130  BP:  (!) 158/90  Pulse: 88 87  Resp: 19 12  Temp:  36.9 C  SpO2: 100% 99%    Last Pain:  Vitals:   05/16/23 1130  TempSrc:   PainSc: 4                  Reed Breech

## 2023-05-16 NOTE — Transfer of Care (Signed)
Immediate Anesthesia Transfer of Care Note  Patient: Ian Young  Procedure(s) Performed: XI ROBOTIC ASSISTED INGUINAL HERNIA REPAIR WITH MESH, recurrent (Left)  Patient Location: PACU  Anesthesia Type:General  Level of Consciousness: drowsy and patient cooperative  Airway & Oxygen Therapy: Patient Spontanous Breathing and Patient connected to face mask oxygen  Post-op Assessment: Report given to RN and Post -op Vital signs reviewed and stable  Post vital signs: stable  Last Vitals:  Vitals Value Taken Time  BP 155/93 05/16/23 1045  Temp    Pulse 93 05/16/23 1049  Resp 12 05/16/23 1049  SpO2 100 % 05/16/23 1049  Vitals shown include unfiled device data.  Last Pain:  Vitals:   05/16/23 0617  TempSrc: Temporal  PainSc: 0-No pain         Complications: No notable events documented.

## 2023-05-16 NOTE — Op Note (Signed)
05/16/2023   10:54 AM   PATIENT:  Ian Young  52 y.o. male   PRE-OPERATIVE DIAGNOSIS:  left inguinal hernia recurrent reducible   POST-OPERATIVE DIAGNOSIS:  Left inguinal hernia recurrent; reducible   PROCEDURE:  Procedure(s): XI ROBOTIC ASSISTED INGUINAL HERNIA REPAIR WITH MESH, recurrent (Left)   SURGEON:  Surgeons and Role:    * Kandis Cocking, MD - Primary   PHYSICIAN ASSISTANT:    ASSISTANTS: none    ANESTHESIA:   general   EBL:  2 mL    BLOOD ADMINISTERED:none   DRAINS: none    LOCAL MEDICATIONS USED:  MARCAINE    and BUPIVICAINE    SPECIMEN:  No Specimen   DISPOSITION OF SPECIMEN:  N/A   COUNTS:  YES   TOURNIQUET:  * No tourniquets in log *   DICTATION: .Dragon Dictation   PLAN OF CARE: Discharge to home after PACU   PATIENT DISPOSITION:  PACU - hemodynamically stable.   Delay start of Pharmacological VTE agent (>24hrs) due to surgical blood loss or risk of bleeding: no  After informed consent was obtained the patient was brought to the operating room placed supine on the operating room table.  General endotracheal anesthesia was induced and his abdomen was then prepped and draped in the usual sterile fashion.  Bilateral arms were tucked and padded appropriately.  A surgical timeout was then called identifying correct patient, site, side and procedure.  A Veress needle was inserted into the peritoneum in the left upper quadrant at Palmer's point.  This was done using standard drop technique and pneumoperitoneum was established.  A supraumbilical 8 mm Optiview robotic port was then placed under direct visualization.  I then examined the Veress needle entry site and there is no evidence of injury.  Two 8 mm robotic trocars were placed in the right hemiabdomen and left hemiabdomen under direct visualization.  The robotic platform was then docked.  Upon survey of the abdomen there was mesh in the left inguinal space.  There did appear to be a small direct hernia  but there did not appear to be a hernia going through the indirect space.  The peritoneum was incised and a peritoneal flap was created.  Medially this was taken down to the pubic symphysis.  The flap was then developed laterally to the abdominal sidewall.  Next I turned my attention to working medially.  The previous mesh was adhered to the inferior epigastric vessels and there were adhesions between the mesh to the anterior abdominal wall.  Using combination of sharp and blunt dissection I was able to create a plane between the mesh and the epigastric vessels.  I followed the vessels down to the iliac vein.  I was able to separate the cord and cord structures from the iliac vein.  I then checked to see if that there was a hernia in the indirect space and there was no hernia sac attached to the cord.  There was some fat herniating through the direct space and this was reduced.  The peritoneum was then widely released to identify all 3 hernia spaces.  Again this dissection was made quite difficult given the adherence of the previous mesh.  I made sure that the old mesh was separated from the landing zones of the new mesh I was going to place so I would have good coverage of the 3 hernia spaces.  I then brought a 3D max mesh onto the field.  This was positioned medially at the  pubic tubercle  It laid flat against the anterior abdominal wall with good coverage.  This was secured to the anterior abdominal wall with a 2-0 Vicryl suture.  Hemostasis was then confirmed and I was happy with how the mesh laid.  The peritoneal flap was then closed with a 2-0 STRATAFIX suture.  There was a small hole in the peritoneum where the previous mesh was that was closed with a 2-0 Vicryl suture.  Bilateral tap blocks were performed with liposomal bupivacaine solution.  The abdomen was then desufflated.  The skin was then closed with 4-0 Monocryl and dressed with glue.  Prior to termination of the procedure all sponge and instrument  counts were correct x 2.

## 2023-05-16 NOTE — Brief Op Note (Signed)
05/16/2023  10:54 AM  PATIENT:  Ian Young  52 y.o. male  PRE-OPERATIVE DIAGNOSIS:  left inguinal hernia recurrent reducible  POST-OPERATIVE DIAGNOSIS:  Left inguinal hernia recurrent; reducible  PROCEDURE:  Procedure(s): XI ROBOTIC ASSISTED INGUINAL HERNIA REPAIR WITH MESH, recurrent (Left)  SURGEON:  Surgeons and Role:    * Kandis Cocking, MD - Primary  PHYSICIAN ASSISTANT:   ASSISTANTS: none   ANESTHESIA:   general  EBL:  2 mL   BLOOD ADMINISTERED:none  DRAINS: none   LOCAL MEDICATIONS USED:  MARCAINE    and BUPIVICAINE   SPECIMEN:  No Specimen  DISPOSITION OF SPECIMEN:  N/A  COUNTS:  YES  TOURNIQUET:  * No tourniquets in log *  DICTATION: .Dragon Dictation  PLAN OF CARE: Discharge to home after PACU  PATIENT DISPOSITION:  PACU - hemodynamically stable.   Delay start of Pharmacological VTE agent (>24hrs) due to surgical blood loss or risk of bleeding: no

## 2023-05-16 NOTE — Anesthesia Preprocedure Evaluation (Addendum)
Anesthesia Evaluation  Patient identified by MRN, date of birth, ID band Patient awake    Reviewed: Allergy & Precautions, NPO status , Patient's Chart, lab work & pertinent test results  History of Anesthesia Complications Negative for: history of anesthetic complications  Airway Mallampati: III  TM Distance: <3 FB Neck ROM: full    Dental  (+) Chipped   Pulmonary sleep apnea , Patient abstained from smoking., former smoker   Pulmonary exam normal        Cardiovascular Exercise Tolerance: Good (-) angina (-) Past MI and (-) DOE negative cardio ROS Normal cardiovascular exam     Neuro/Psych negative neurological ROS  negative psych ROS   GI/Hepatic negative GI ROS, Neg liver ROS,neg GERD  ,,  Endo/Other  negative endocrine ROS    Renal/GU      Musculoskeletal   Abdominal   Peds  Hematology  (+) REFUSES BLOOD PRODUCTS  Anesthesia Other Findings Past Medical History: No date: Elevated blood pressure reading without diagnosis of  hypertension No date: Inguinal hernia No date: Neoplasm of uncertain behavior of skin No date: Shingles  Past Surgical History: No date: HERNIA REPAIR  BMI    Body Mass Index: 22.70 kg/m      Reproductive/Obstetrics negative OB ROS                             Anesthesia Physical Anesthesia Plan  ASA: 3  Anesthesia Plan: General ETT   Post-op Pain Management:    Induction: Intravenous  PONV Risk Score and Plan: Ondansetron, Dexamethasone, Midazolam and Treatment may vary due to age or medical condition  Airway Management Planned: Oral ETT  Additional Equipment:   Intra-op Plan:   Post-operative Plan: Extubation in OR  Informed Consent: I have reviewed the patients History and Physical, chart, labs and discussed the procedure including the risks, benefits and alternatives for the proposed anesthesia with the patient or authorized  representative who has indicated his/her understanding and acceptance.     Dental Advisory Given  Plan Discussed with: Anesthesiologist, CRNA and Surgeon  Anesthesia Plan Comments: (Patient consented for risks of anesthesia including but not limited to:  - adverse reactions to medications - damage to eyes, teeth, lips or other oral mucosa - nerve damage due to positioning  - sore throat or hoarseness - Damage to heart, brain, nerves, lungs, other parts of body or loss of life  Patient voiced understanding and assent.)       Anesthesia Quick Evaluation

## 2023-06-02 ENCOUNTER — Ambulatory Visit: Payer: Managed Care, Other (non HMO) | Admitting: General Surgery

## 2023-06-02 ENCOUNTER — Encounter: Payer: Self-pay | Admitting: General Surgery

## 2023-06-02 VITALS — BP 155/88 | HR 92 | Temp 98.8°F | Ht 70.0 in | Wt 156.8 lb

## 2023-06-02 DIAGNOSIS — K4091 Unilateral inguinal hernia, without obstruction or gangrene, recurrent: Secondary | ICD-10-CM

## 2023-06-02 DIAGNOSIS — Z09 Encounter for follow-up examination after completed treatment for conditions other than malignant neoplasm: Secondary | ICD-10-CM

## 2023-06-02 NOTE — Patient Instructions (Signed)

## 2023-06-02 NOTE — Progress Notes (Signed)
Outpatient Surgical Follow Up  06/02/2023  Ian Young is an 52 y.o. male.   Chief Complaint  Patient presents with   Routine Post Op    Left inguinal hernia repair 05/16/23    HPI: Patient returns to clinic status post robotic assisted left inguinal hernia repair.  He had a current hernia and had previous mesh placement.  He reports that initially he had swelling in his scrotum with some testicular tenderness but that is improved.  He also notes some continued abdominal swelling and tenderness.  He initially had some constipation with oxycodone but now is having bowel movements and tolerating a regular diet.  He does report some musculoskeletal bilateral forearm pain.  Past Medical History:  Diagnosis Date   Elevated blood pressure reading without diagnosis of hypertension    Inguinal hernia    Neoplasm of uncertain behavior of skin    Shingles     Past Surgical History:  Procedure Laterality Date   HERNIA REPAIR     XI ROBOTIC ASSISTED INGUINAL HERNIA REPAIR WITH MESH Left 05/16/2023   Procedure: XI ROBOTIC ASSISTED INGUINAL HERNIA REPAIR WITH MESH, recurrent;  Surgeon: Kandis Cocking, MD;  Location: [facility] ORS;  Service: General;  Laterality: Left;    History reviewed. No pertinent family history.  Social History:  reports that he has quit smoking. His smoking use included cigarettes. He does not have any smokeless tobacco history on file. He reports that he does not drink alcohol and does not use drugs.  Allergies: No Known Allergies  Medications reviewed.    ROS Full ROS performed and is otherwise negative other than what is stated in HPI   BP (!) 155/88   Pulse 92   Temp 98.8 F (37.1 C) (Oral)   Ht 5\' 10"  (1.778 m)   Wt 156 lb 12.8 oz (71.1 kg)   SpO2 99%   BMI 22.50 kg/m   Physical Exam No acute distress, regular rate and rhythm, normal work of breathing on room air, moving all extremities spontaneously abdomen is soft, distended, tenderness over his  surgical incisions.  These are healing well and they still have some glue on them.  He has no swelling of his left testicle nor tenderness with palpation of the left testicle.  He has no evidence of recurrence of hernia on Valsalva.    No results found for this or any previous visit (from the past 48 hour(s)). No results found.  Assessment/Plan:  Patient status post robotic assisted inguinal hernia repair.  He is doing well without evidence of recurrence of disease.  Continue lifting restrictions for 2 more weeks.  He can liberalize his diet.  He can follow-up with [place] on an as-needed basis  Baker Pierini, M.D. [facility]
